# Patient Record
Sex: Female | Born: 1940 | Race: White | Hispanic: No | Marital: Single | Smoking: Never smoker
Health system: Southern US, Community
[De-identification: ages and names within clinical notes are randomized; demographics above are authoritative.]

## PROBLEM LIST (undated history)

## (undated) DIAGNOSIS — I471 Supraventricular tachycardia: Secondary | ICD-10-CM

## (undated) DIAGNOSIS — M1990 Unspecified osteoarthritis, unspecified site: Secondary | ICD-10-CM

---

## 1997-11-29 ENCOUNTER — Other Ambulatory Visit: Admission: RE | Admit: 1997-11-29 | Discharge: 1997-11-29 | Payer: Self-pay | Admitting: *Deleted

## 1998-12-03 ENCOUNTER — Other Ambulatory Visit: Admission: RE | Admit: 1998-12-03 | Discharge: 1998-12-03 | Payer: Self-pay | Admitting: *Deleted

## 1999-11-25 ENCOUNTER — Other Ambulatory Visit: Admission: RE | Admit: 1999-11-25 | Discharge: 1999-11-25 | Payer: Self-pay | Admitting: *Deleted

## 2000-11-30 ENCOUNTER — Other Ambulatory Visit: Admission: RE | Admit: 2000-11-30 | Discharge: 2000-11-30 | Payer: Self-pay | Admitting: *Deleted

## 2001-12-01 ENCOUNTER — Other Ambulatory Visit: Admission: RE | Admit: 2001-12-01 | Discharge: 2001-12-01 | Payer: Self-pay | Admitting: *Deleted

## 2002-12-05 ENCOUNTER — Other Ambulatory Visit: Admission: RE | Admit: 2002-12-05 | Discharge: 2002-12-05 | Payer: Self-pay | Admitting: *Deleted

## 2017-12-10 ENCOUNTER — Emergency Department: Payer: Medicare Other

## 2017-12-10 ENCOUNTER — Emergency Department
Admission: EM | Admit: 2017-12-10 | Discharge: 2017-12-10 | Disposition: A | Discharge status: Home / Self Care | Payer: Medicare Other | Attending: Emergency Medicine | Admitting: Emergency Medicine

## 2017-12-10 ENCOUNTER — Other Ambulatory Visit: Payer: Self-pay

## 2017-12-10 ENCOUNTER — Encounter: Payer: Self-pay | Admitting: Emergency Medicine

## 2017-12-10 DIAGNOSIS — R42 Dizziness and giddiness: Secondary | ICD-10-CM | POA: Diagnosis not present

## 2017-12-10 DIAGNOSIS — R51 Headache: Secondary | ICD-10-CM | POA: Insufficient documentation

## 2017-12-10 HISTORY — DX: Supraventricular tachycardia: I47.1

## 2017-12-10 HISTORY — DX: Unspecified osteoarthritis, unspecified site: M19.90

## 2017-12-10 LAB — COMPREHENSIVE METABOLIC PANEL
ALT: 16 U/L (ref 0–44)
AST: 22 U/L (ref 15–41)
Albumin: 4 g/dL (ref 3.5–5.0)
Alkaline Phosphatase: 43 U/L (ref 38–126)
Anion gap: 7 (ref 5–15)
BUN: 13 mg/dL (ref 8–23)
CO2: 27 mmol/L (ref 22–32)
CREATININE: 0.61 mg/dL (ref 0.44–1.00)
Calcium: 8.9 mg/dL (ref 8.9–10.3)
Chloride: 106 mmol/L (ref 98–111)
Glucose, Bld: 103 mg/dL — ABNORMAL HIGH (ref 70–99)
POTASSIUM: 4.1 mmol/L (ref 3.5–5.1)
SODIUM: 140 mmol/L (ref 135–145)
Total Bilirubin: 0.7 mg/dL (ref 0.3–1.2)
Total Protein: 6.6 g/dL (ref 6.5–8.1)

## 2017-12-10 LAB — CBC
HEMATOCRIT: 38 % (ref 35.0–47.0)
Hemoglobin: 12.9 g/dL (ref 12.0–16.0)
MCH: 29.4 pg (ref 26.0–34.0)
MCHC: 34.1 g/dL (ref 32.0–36.0)
MCV: 86.2 fL (ref 80.0–100.0)
Platelets: 220 10*3/uL (ref 150–440)
RBC: 4.41 MIL/uL (ref 3.80–5.20)
RDW: 13.6 % (ref 11.5–14.5)
WBC: 7.1 10*3/uL (ref 3.6–11.0)

## 2017-12-10 LAB — TROPONIN I: Troponin I: <0.03 ng/mL (ref <0.03)

## 2017-12-10 MED ORDER — MECLIZINE HCL 25 MG PO TABS: 25.0000 mg | ORAL_TABLET | Freq: Two times a day (BID) | ORAL | 0 refills | Status: AC | PRN

## 2017-12-10 NOTE — ED Triage Notes (Signed)
Says left side headache last night about 10 pm.  When she got up today feels like something wrong.  Says she feels dizzy,, lightheaded and off balance.

## 2017-12-10 NOTE — ED Triage Notes (Signed)
Triage by me 

## 2017-12-10 NOTE — ED Notes (Signed)
Dr. Cyril LoosenKinner aware pt has hx of dysphagia and could not complete swallow screen.

## 2017-12-10 NOTE — ED Provider Notes (Signed)
[facility] Emergency Department Provider Note   ____________________________________________    I have reviewed the triage vital signs and the nursing notes.   HISTORY  Chief Complaint Headache and Dizziness     HPI Erin Clark is a 77 y.o. female who presents with complaints of headache and dizziness.  Patient reports that approximately 10 PM last night before going to sleep she developed a sharp 7 out of 10 relatively abrupt pain in the left parietal area.  She said this is quite unusual for her and concerned her.  She had a sensation of something crawling on her head as well.  However it was relatively brief pain and she went to sleep.  This morning after getting up she felt as though the room was spinning and that it was difficult to walk straight.  She denies a history of vertigo.  She does report having some tinnitus earlier in the week but none currently.  No current headache.  No extremity weakness or numbness   Past Medical History:  Diagnosis Date  . Arthritis   . Atrial tachycardia (HCC)     There are no active problems to display for this patient.   History reviewed. No pertinent surgical history.  Prior to Admission medications   Medication Sig Start Date End Date Taking? Authorizing Provider  Cholecalciferol (D3-1000) 1000 units tablet Take 1,000 Units by mouth daily.   Yes [provider]  metoprolol tartrate (LOPRESSOR) 25 MG tablet Take 12.5 mg by mouth 2 (two) times daily. 12/01/17  Yes [provider]  mometasone (ELOCON) 0.1 % cream Apply 1 application topically 2 (two) times daily as needed for irritation. 10/20/17  Yes [provider]  Multiple Vitamin (MULTI-VITAMINS) TABS Take 1 tablet by mouth daily.   Yes [provider]  Calcium Carb-Cholecalciferol (CALCIUM 1000 + D PO) Take 1,000 Units by mouth daily.     [provider]  meclizine (ANTIVERT) 25 MG tablet Take 1 tablet (25 mg  total) by mouth 2 (two) times daily as needed for dizziness. 12/10/17   Jene EveryKinner, Alexander Mcauley, MD     Allergies Amoxicillin  No family history on file.  Social History Social History   Tobacco Use  . Smoking status: Never Smoker  . Smokeless tobacco: Never Used  Substance Use Topics  . Alcohol use: Yes  . Drug use: Not on file    Review of Systems  Constitutional: Dizziness as above Eyes: No visual changes.  ENT: No neck pain Cardiovascular: Denies chest pain.  No palpitations Respiratory: Denies shortness of breath. Gastrointestinal:No nausea, no vomiting.   Genitourinary: Negative for dysuria. Musculoskeletal: Negative for back pain. Skin: Negative for rash. Neurological: As above   ____________________________________________   PHYSICAL EXAM:  VITAL SIGNS: ED Triage Vitals  Enc Vitals Group     BP 12/10/17 0756 (!) 171/62     Pulse Rate 12/10/17 0756 68     Resp 12/10/17 0756 14     Temp 12/10/17 0756 97.8 F (36.6 C)     Temp Source 12/10/17 0756 Oral     SpO2 12/10/17 0756 97 %     Weight 12/10/17 0757 61.2 kg (135 lb)     Height 12/10/17 0757 1.6 m (5\' 3" )     Head Circumference --      Peak Flow --      Pain Score 12/10/17 0757 0     Pain Loc --      Pain Edu? --  Excl. in GC? --     Constitutional: Alert and oriented. No acute distress. Pleasant and interactive Eyes: Conjunctivae are normal.  PERRLA Head: Atraumatic. ENT: Normal TMs bilaterally Mouth/Throat: Mucous membranes are moist.   Neck:  Painless ROM Cardiovascular: Normal rate, regular rhythm. Grossly normal heart sounds.  Good peripheral circulation. Respiratory: Normal respiratory effort.  No retractions. Lungs CTAB. Gastrointestinal: Soft and nontender. No distention.   Musculoskeletal: Normal strength in all extremities, warm and well-perfused Neurologic:  Normal speech and language. No gross focal neurologic deficits are appreciated.  Cranial nerves II through XII are normal, no  dysdiadochokinesis.  Upon standing the patient feels somewhat unsteady and drifts to the left but is able to maintain her balance Skin:  Skin is warm, dry and intact. No rash noted. Psychiatric: Mood and affect are normal. Speech and behavior are normal.  ____________________________________________   LABS (all labs ordered are listed, but only abnormal results are displayed)  Labs Reviewed  COMPREHENSIVE METABOLIC PANEL - Abnormal; Notable for the following components:      Result Value   Glucose, Bld 103 (*)    All other components within normal limits  CBC  TROPONIN I   ____________________________________________  EKG  ED ECG REPORT I, Jene Everyobert Krislynn Gronau, the attending physician, personally viewed and interpreted this ECG.  Date: 12/10/2017  Rhythm: normal sinus rhythm QRS Axis: normal Intervals: normal ST/T Wave abnormalities: normal Narrative Interpretation: no evidence of acute ischemia  ____________________________________________  RADIOLOGY  CT head ____________________________________________   PROCEDURES  Procedure(s) performed: No  Procedures   Critical Care performed: No ____________________________________________   INITIAL IMPRESSION / ASSESSMENT AND PLAN / ED COURSE  Pertinent labs & imaging results that were available during my care of the patient were reviewed by me and considered in my medical decision making (see chart for details).  Patient presents with dizziness and a headache as noted above, currently her symptoms are mostly relieved although still unsteady with standing.  Differential includes a peripheral vertigo versus potentially a CVA the onset of which would have been when she had a headache last night at 10 PM, outside of the TPA window.  We will check labs, CT head and reevaluate  Lab work is reassuring, CT head is normal.  Will obtain MRI to rule out CVA   MRI negative, patient asymptomatic.  Suspect vertigo as the cause of  her symptoms.  Follow-up with ENT as needed,    ____________________________________________   FINAL CLINICAL IMPRESSION(S) / ED DIAGNOSES  Final diagnoses:  Vertigo        Note:  This document was prepared using Dragon voice recognition software and may include unintentional dictation errors.    Jene EveryKinner, Kerolos Nehme, MD 12/10/17 1332

## 2018-02-11 ENCOUNTER — Other Ambulatory Visit: Payer: Self-pay

## 2018-02-11 ENCOUNTER — Encounter: Payer: Self-pay | Admitting: Occupational Therapy

## 2018-02-11 ENCOUNTER — Ambulatory Visit: Payer: Medicare Other | Attending: Internal Medicine | Admitting: Occupational Therapy

## 2018-02-11 DIAGNOSIS — M25642 Stiffness of left hand, not elsewhere classified: Secondary | ICD-10-CM

## 2018-02-11 DIAGNOSIS — M25641 Stiffness of right hand, not elsewhere classified: Secondary | ICD-10-CM | POA: Diagnosis present

## 2018-02-11 NOTE — Therapy (Signed)
[facility] [address] [place], [address] Phone: [phone]   Fax:  [phone]  Occupational Therapy Evaluation  Patient Details  Name: Erin Clark MRN: [id number] Date of Birth: October 06, 1940 Referring Provider (OT): Wilfrid Lund Date: 02/11/2018  OT End of Session - 02/11/18 1607    Visit Number  1    Number of Visits  3    Date for OT Re-Evaluation  03/11/18    OT Start Time  1002    OT Stop Time  1100    OT Time Calculation (min)  58 min    Activity Tolerance  Patient tolerated treatment well    Behavior During Therapy  [facility] for tasks assessed/performed       Past Medical History:  Diagnosis Date  . Arthritis   . Atrial tachycardia (HCC)     History reviewed. No pertinent surgical history.  There were no vitals filed for this visit.  Subjective Assessment - 02/11/18 1501    Subjective   I have OA for while - and I don't really have pain but my fingers are tight and stiff when making fist - and cannot make full fist -and my middle finger worse on the R , L few fingers - I just don't want my hands to get worse     Patient Stated Goals  I don't want my fingers to get worse - if I can do at home to keep my motion, strength     Currently in Pain?  No/denies        [facility] OT Assessment - 02/11/18 0001      Assessment   Medical Diagnosis  OA with bilateral hand pain     Referring Provider (OT)  Renard Matter    Onset Date/Surgical Date  11/11/17    Hand Dominance  Right      Home  Environment   Lives With  Alone      Prior Function   Vocation  Retired    Leisure  retired Runner, broadcasting/film/video , like some gardening , but pots, [employer] work , Physiological scientist Grip (lbs)  37    Right Hand Lateral Pinch  14 lbs    Right Hand 3 Point Pinch  12 lbs    Left Hand Grip (lbs)  36    Left Hand Lateral Pinch  13 lbs    Left Hand 3 Point Pinch  10 lbs      Right Hand AROM   R Index  MCP 0-90   90 Degrees    R Index PIP 0-100  95 Degrees    R Long  MCP 0-90  100 Degrees    R Long PIP 0-100  65 Degrees    R Ring  MCP 0-90  100 Degrees    R Ring PIP 0-100  90 Degrees    R Little  MCP 0-90  100 Degrees    R Little PIP 0-100  90 Degrees      Left Hand AROM   L Index  MCP 0-90  100 Degrees    L Index PIP 0-100  65 Degrees    L Long  MCP 0-90  105 Degrees    L Long PIP 0-100  85 Degrees    L Ring  MCP 0-90  100 Degrees    L Ring PIP 0-100  90 Degrees    L Little  MCP 0-90  105 Degrees    L Little PIP 0-100  95 Degrees             Paraffin done to bilateral hands prior to review of HEP     Moist heat Tendon glides- only AROM -do not force  Opposition to all digits RD of digits  Oval 8 splint on R 4th PIP to decrease hyper extention of PIP - and clicking or swannek   to use during day - as needed and with activities - increase tolerance gradually   Joint protection info and AE info review and hand out provided    o     OT Education - 02/11/18 1607    Education Details  findings of eval , HEP and joint protection /AE trng     Person(s) Educated  Patient    Methods  Explanation;Demonstration;Handout    Comprehension  Verbalized understanding;Returned demonstration       OT Short Term Goals - 02/11/18 1616      OT SHORT TERM GOAL #1   Title  Pt to be independent in HEP to increase or maintain AROM in digits, grip strength and prevent deformities or contractures     Baseline  no knowledge     Time  22    Period  Weeks    Status  New    Target Date  02/25/18        OT Long Term Goals - 02/11/18 1617      OT LONG TERM GOAL #1   Title  Pt to verbalize and demo 3 joint protection principles or AE she use to increase ease on joints in home act    Baseline  no knowledge    Time  4    Period  Weeks    Status  New    Target Date  03/11/18            Plan - 02/11/18 1608    Clinical Impression Statement  Pt present at OT eval with  diagnosis of  OA with bilateral hand stiffness- L hand worse than R - pt show decrease PIP flexion with hyper mobiliity out of MC's - hyper extention of R 4th - with swanneck - pt fitted with Oval 8 to decrease  swanneck risk - pt do show clicking of that joint - pt show on L hand ulnar deivation of digits but no subluxation - pt was ed on HEP for ROM , joint protection  and AE education was done - pt can benefit from OT services to maintain and increase ROM - decrease risk for contractures ,and pain - and iindependence in ADL's and IADL's     Occupational performance deficits (Please refer to evaluation for details):  ADL's;IADL's;Play;Leisure    Rehab Potential  Good    Current Impairments/barriers affecting progress:  chronic condition -     OT Frequency  Biweekly    OT Duration  4 weeks    OT Treatment/Interventions  Self-care/ADL training;Therapeutic exercise;Patient/family education;Paraffin;Passive range of motion;Manual Therapy;DME and/or AE instruction    Plan  progress with homeprogram  and adjust or update as needed     Clinical Decision Making  Several treatment options, min-mod task modification necessary    OT Home Exercise Plan  see pt instruction    Consulted and Agree with Plan of Care  Patient       Patient will benefit from skilled therapeutic intervention in order to improve the following deficits and impairments:  Impaired  flexibility, Pain, Decreased coordination, Decreased strength, Decreased range of motion, Impaired UE functional use  Visit Diagnosis: Stiffness of left hand, not elsewhere classified - Plan: Ot plan of care cert/re-cert  Stiffness of right hand, not elsewhere classified - Plan: Ot plan of care cert/re-cert    Problem List There are no active problems to display for this patient.   Oletta Cohn OTR/L,CLT 02/11/2018, 4:24 PM  [facility] [address], [place], [address] Phone:  [phone]   Fax:  [phone]  Name: Erin Clark MRN: [id number] Date of Birth: 13-Sep-1940

## 2018-02-11 NOTE — Patient Instructions (Signed)
Moist heat Tendon glides- only AROM -do not force  Opposition to all digits RD of digits  Oval 8 splint on R 4th PIP to decrease hyper extention of PIP - and clicking or swannek   to use during day - as needed and with activities - increase tolerance gradually   Joint protection info and AE info review and hand out provided

## 2018-02-22 ENCOUNTER — Ambulatory Visit: Payer: Medicare Other | Admitting: Occupational Therapy

## 2018-02-22 DIAGNOSIS — M25642 Stiffness of left hand, not elsewhere classified: Secondary | ICD-10-CM | POA: Diagnosis not present

## 2018-02-22 DIAGNOSIS — M25641 Stiffness of right hand, not elsewhere classified: Secondary | ICD-10-CM

## 2018-02-22 NOTE — Therapy (Signed)
[place] [facility] [address], [place], [address] Phone: [phone]   Fax:  [phone]  Occupational Therapy Treatment  Patient Details  Name: Erin Clark MRN: [id number] Date of Birth: 05-27-1940 Referring Provider (OT): Wilfrid Lund Date: 02/22/2018  OT End of Session - 02/22/18 1417    Visit Number  2    Number of Visits  3    Date for OT Re-Evaluation  03/11/18    OT Start Time  1316    OT Stop Time  1350    OT Time Calculation (min)  34 min    Activity Tolerance  Patient tolerated treatment well    Behavior During Therapy  [facility] for tasks assessed/performed       Past Medical History:  Diagnosis Date  . Arthritis   . Atrial tachycardia (HCC)     No past surgical history on file.  There were no vitals filed for this visit.  Subjective Assessment - 02/22/18 1415    Subjective   I did most every thing you told me - on and off - my brother past away and I had mouth surgery - so it was busy since I seen you last - but I am trying to change the way I use my hands     Patient Stated Goals  I don't want my fingers to get worse - if I can do at home to keep my motion, strength     Currently in Pain?  No/denies         [facility] OT Assessment - 02/22/18 0001      Strength   Right Hand Grip (lbs)  45    Right Hand Lateral Pinch  15 lbs    Right Hand 3 Point Pinch  14 lbs    Left Hand Grip (lbs)  41    Left Hand Lateral Pinch  13 lbs    Left Hand 3 Point Pinch  10 lbs      Left Hand AROM   L Index PIP 0-100  70 Degrees    L Ring PIP 0-100  95 Degrees        Assess AROM in bilateral hands  And grip /prehension strength - see flowsheet Assess oval 8 on R 4th - appear to large - will fit when size 8 comes in        OT Treatments/Exercises (OP) - 02/22/18 0001      RUE Paraffin   Number Minutes Paraffin  10 Minutes    RUE Paraffin Location  Hand    Comments  decrease stiffness prior  to soft tissue mobs       LUE Paraffin   Number Minutes Paraffin  10 Minutes    LUE Paraffin Location  Hand    Comments  prior to soft tissue mobs to decrease stiffness       soft tissue mobs done to bilateral hands - MC spreads and to webspace prior to ROM  Review tendon glides  And Opposition  RD of digits   Review joint protection  And AE  Large joint , avoid tight and prolonged stretch  Pt report making changes -        OT Education - 02/22/18 1417    Education Details  review homeprogram for joint protection, AE and ROM     Person(s) Educated  Patient    Methods  Explanation;Demonstration;Handout    Comprehension  Verbalized understanding;Returned demonstration  OT Short Term Goals - 02/11/18 1616      OT SHORT TERM GOAL #1   Title  Pt to be independent in HEP to increase or maintain AROM in digits, grip strength and prevent deformities or contractures     Baseline  no knowledge     Time  22    Period  Weeks    Status  New    Target Date  02/25/18        OT Long Term Goals - 02/11/18 1617      OT LONG TERM GOAL #1   Title  Pt to verbalize and demo 3 joint protection principles or AE she use to increase ease on joints in home act    Baseline  no knowledge    Time  4    Period  Weeks    Status  New    Target Date  03/11/18            Plan - 02/22/18 1418    Clinical Impression Statement  Pt with diagnosis of OA pt show increase AROM at PIP of L 2nd -and increase grip bilateral hands and increase prehension on R - pt show understanding for HEP  , joint protection and AE -  pt will return upon size 8 oval 8 splint comes in to fit on R 4th PIP  - and cont with hoemprogram     Occupational performance deficits (Please refer to evaluation for details):  ADL's;IADL's;Play;Leisure    Rehab Potential  Good    Current Impairments/barriers affecting progress:  chronic condition -     OT Frequency  Biweekly    OT Duration  2 weeks    OT  Treatment/Interventions  Self-care/ADL training;Therapeutic exercise;Patient/family education;Paraffin;Passive range of motion;Manual Therapy;DME and/or AE instruction    Plan  fit pt with smaller oval 8 size 8 splint on R  4th PIP     Clinical Decision Making  Several treatment options, min-mod task modification necessary    OT Home Exercise Plan  see pt instruction    Consulted and Agree with Plan of Care  Patient       Patient will benefit from skilled therapeutic intervention in order to improve the following deficits and impairments:  Impaired flexibility, Pain, Decreased coordination, Decreased strength, Decreased range of motion, Impaired UE functional use  Visit Diagnosis: Stiffness of left hand, not elsewhere classified  Stiffness of right hand, not elsewhere classified    Problem List There are no active problems to display for this patient.   Oletta Cohn OTR/L,CLT 02/22/2018, 2:22 PM   [facility] [address] [place], [address] Phone: [phone]   Fax:  [phone]  Name: Erin Clark MRN: [id number] Date of Birth: [birth year]

## 2018-02-22 NOTE — Patient Instructions (Signed)
Same

## 2020-02-04 IMAGING — MR MR HEAD W/O CM
10 series · 48 of 48 positions shown · non-contrast
Comparison: Head CT without contrast 905 hours today.

CLINICAL DATA: 77-year-old female with left side headache since
[DATE] hours. Dizziness, off balance.

EXAM:
MRI HEAD WITHOUT CONTRAST
TECHNIQUE: Multiplanar, multiecho pulse sequences of the brain and surrounding
structures were obtained without intravenous contrast.

[Series 2: T1 · sagittal · 5.0mm · 0.45mm/px · 2 of 29 slices shown (1 of 2)]
[im 1/29]
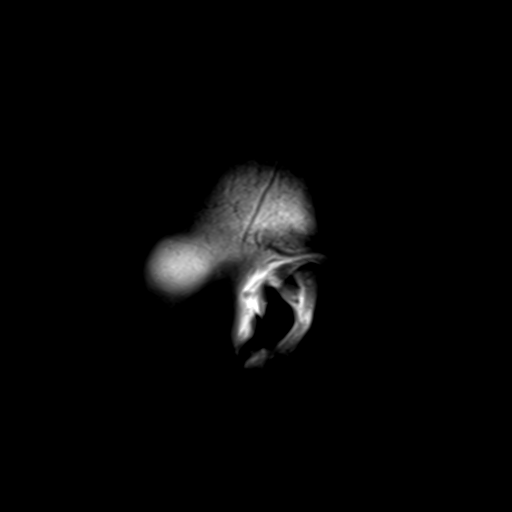
[im 29/29]
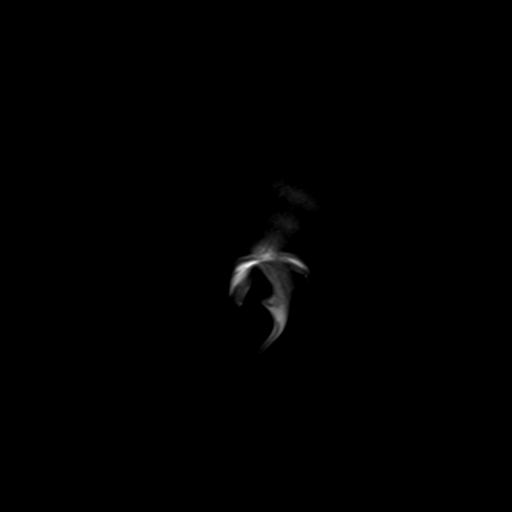

[Series 4: DWI · axial · 3.0mm · 1.80mm/px · z∈[-24,+134]mm · 5 of 52 slices shown (1 of 2)]
[im 1/52]
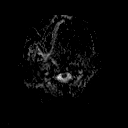
[im 13/52]
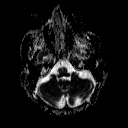
[im 26/52]
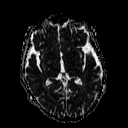
[im 39/52]
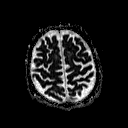
[im 52/52]
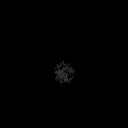

[Series 6: DWI · coronal · 3.0mm · 1.80mm/px · 4 of 48 slices shown (2 of 2)]
[im 1/48]
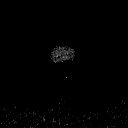
[im 16/48]
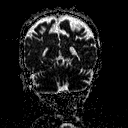
[im 32/48]
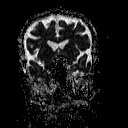
[im 48/48]
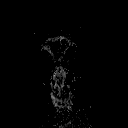

[Series 7: T2 · axial · 5.0mm · 0.60mm/px · z∈[-23,+130]mm · 2 of 25 slices shown (1 of 3)]
[im 1/25]
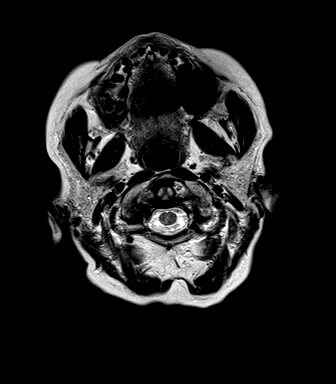
[im 25/25]
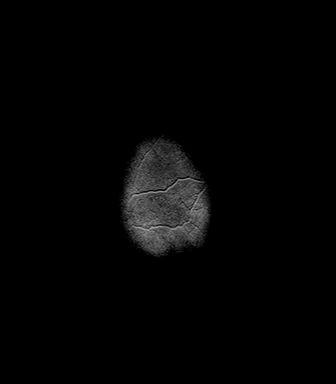

[Series 8: FLAIR · axial · 3.0mm · 0.45mm/px · z∈[-23,+130]mm · 5 of 53 slices shown]
[im 1/53]
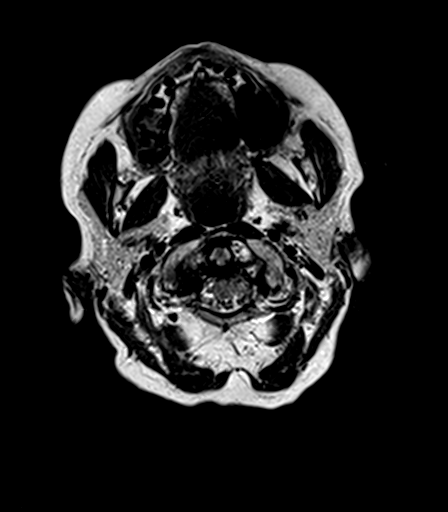
[im 14/53]
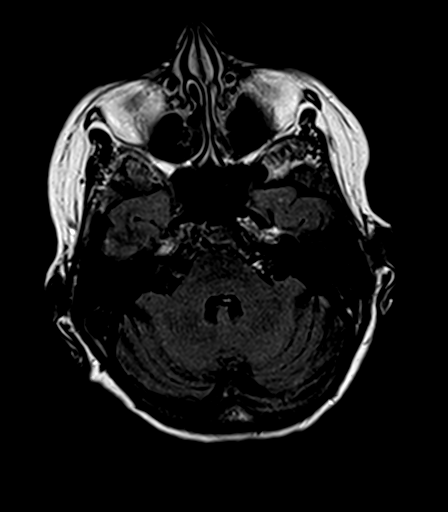
[im 27/53]
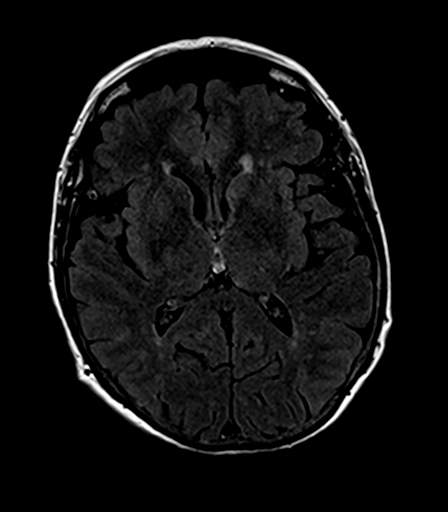
[im 40/53]
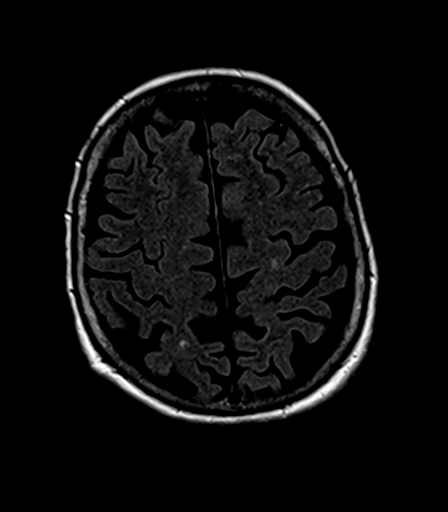
[im 53/53]
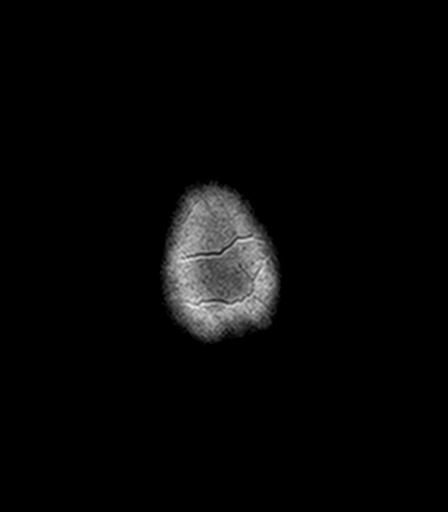

[Series 9: T2 · axial · 5.0mm · 0.45mm/px · z∈[-23,+130]mm · 2 of 25 slices shown (2 of 3)]
[im 1/25]
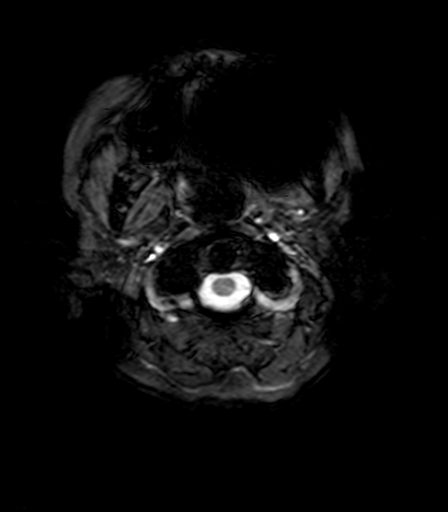
[im 25/25]
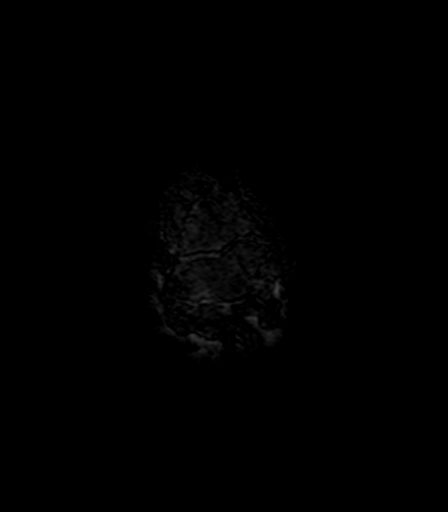

[Series 10: T1 · axial · 1.0mm · 1.00mm/px · z∈[-30,+142]mm · 16 of 176 slices shown (2 of 2)]
[im 1/176]
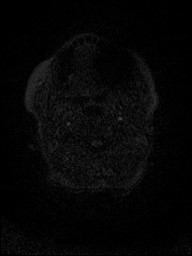
[im 12/176]
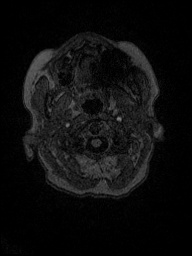
[im 24/176]
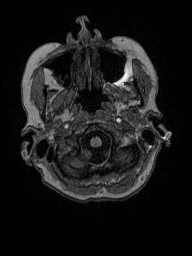
[im 36/176]
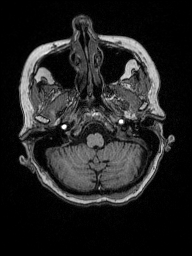
[im 47/176]
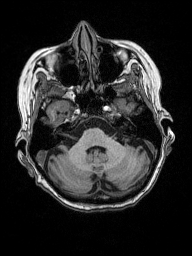
[im 59/176]
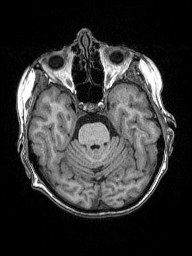
[im 71/176]
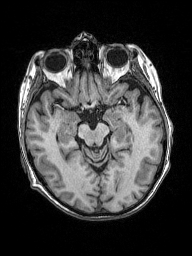
[im 82/176]
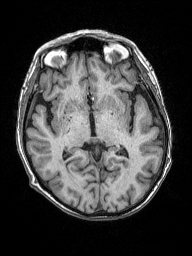
[im 94/176]
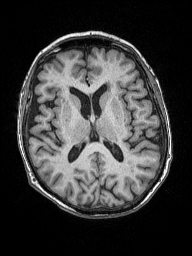
[im 106/176]
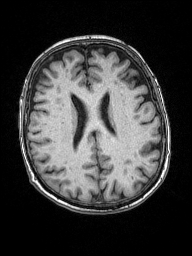
[im 117/176]
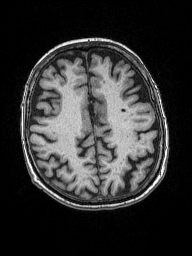
[im 129/176]
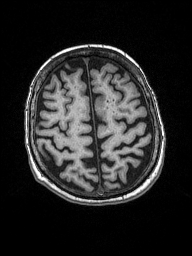
[im 141/176]
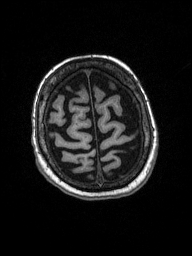
[im 152/176]
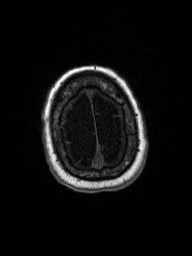
[im 164/176]
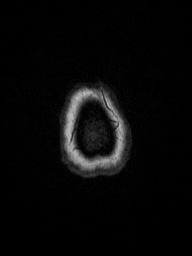
[im 176/176]
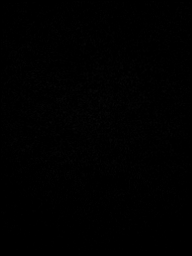

[Series 11: T2 · coronal · 5.0mm · 0.49mm/px · 3 of 29 slices shown (3 of 3)]
[im 1/29]
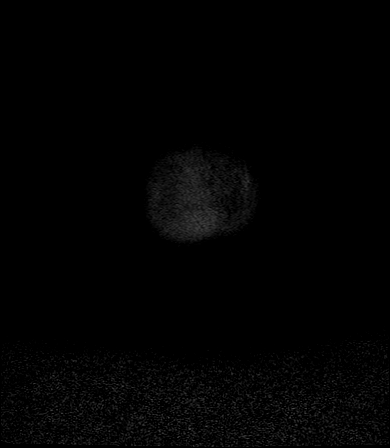
[im 15/29]
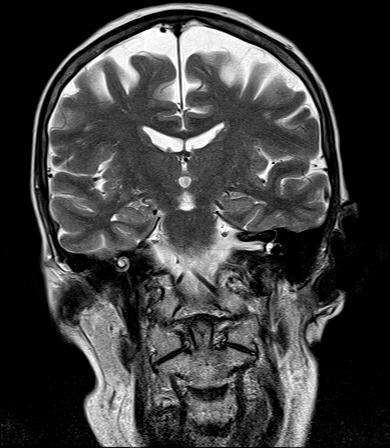
[im 29/29]
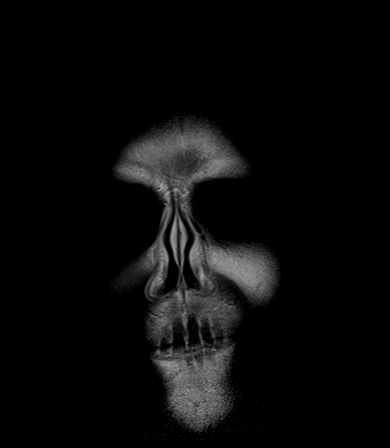

[Series 100: ax (id) · axial · 3.0mm · 1.80mm/px · z∈[-24,+134]mm · 5 of 55 slices shown]
[im 1/55]
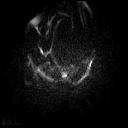
[im 14/55]
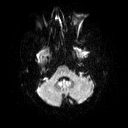
[im 28/55]
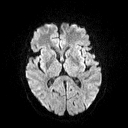
[im 41/55]
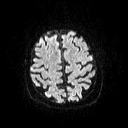
[im 55/55]
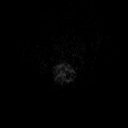

[Series 101: cor (id) · coronal · 3.0mm · 1.80mm/px · 4 of 48 slices shown]
[im 1/48]
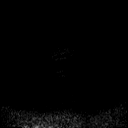
[im 16/48]
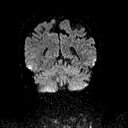
[im 32/48]
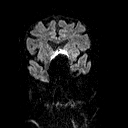
[im 48/48]
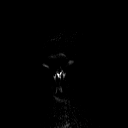

[48 of 48 positions shown; findings below may reference images not displayed]

FINDINGS: Brain: No restricted diffusion to suggest acute infarction. No
midline shift, mass effect, evidence of mass lesion,
ventriculomegaly, extra-axial collection or acute intracranial
hemorrhage. Cervicomedullary junction and pituitary are within
normal limits.

Cerebral volume is within normal limits for age. Scattered small
foci of nonspecific cerebral white matter T2 and FLAIR
hyperintensity in both hemispheres, mostly subcortical. No
superimposed cortical encephalomalacia. No chronic cerebral blood
products. The bilateral deep gray matter nuclei, brainstem, and
cerebellum appear normal for age.

Vascular: Major intracranial vascular flow voids are preserved. The
distal left vertebral artery appears mildly dominant.

Skull and upper cervical spine: Normal visible cervical spine.
Normal bone marrow signal.

Sinuses/Orbits: Postoperative changes to both globes. Otherwise
normal orbits soft tissues. Paranasal sinuses and mastoids are
stable and well pneumatized.

Other: Visible internal auditory structures appear normal. Normal
stylomastoid foramina. Scalp and face soft tissues appear negative.
IMPRESSION: 1.  No acute intracranial abnormality.
2. Largely unremarkable for age; mild for age nonspecific cerebral
white matter signal changes.

## 2021-09-25 ENCOUNTER — Ambulatory Visit (LOCAL_COMMUNITY_HEALTH_CENTER): Payer: Medicare Other

## 2021-09-25 DIAGNOSIS — Z23 Encounter for immunization: Secondary | ICD-10-CM | POA: Diagnosis not present

## 2021-09-25 NOTE — Progress Notes (Signed)
  Are you feeling sick today? No   Have you ever received a dose of COVID-19 Vaccine? [employer], [place], [place], [place], Other) Yes  If yes, which vaccine and how many doses?   Pfizer and 5 doses   Did you bring the vaccination record card or other documentation?  Yes   Do you have a health condition or are undergoing treatment that makes you moderately or severely immunocompromised? This would include, but not be limited to: cancer, HIV, organ transplant, immunosuppressive therapy/high-dose corticosteroids, or moderate/severe primary immunodeficiency.  No  Have you received COVID-19 vaccine before or during hematopoietic cell transplant (HCT) or CAR-T-cell therapies? No  Have you ever had an allergic reaction to: (This would include a severe allergic reaction or a reaction that caused hives, swelling, or respiratory distress, including wheezing.) A component of a COVID-19 vaccine or a previous dose of COVID-19 vaccine? No   Have you ever had an allergic reaction to another vaccine (other thanCOVID-19 vaccine) or an injectable medication? (This would include a severe allergic reaction or a reaction that caused hives, swelling, or respiratory distress, including wheezing.)   No    Do you have a history of any of the following:  Myocarditis or Pericarditis No  Dermal fillers:  No  Multisystem Inflammatory Syndrome (MIS-C or MIS-A)? No  COVID-19 disease within the past 3 months? No  Vaccinated with monkeypox vaccine in the last 4 weeks? No  In Nurse Clinic for [facility] booster. Tolerated vaccine well. Updated Covid vaccine card and [facility] copy given. Josie Saunders, RN

## 2021-12-15 ENCOUNTER — Other Ambulatory Visit: Payer: Self-pay | Admitting: Unknown Physician Specialty

## 2021-12-15 DIAGNOSIS — J329 Chronic sinusitis, unspecified: Secondary | ICD-10-CM

## 2022-01-02 ENCOUNTER — Ambulatory Visit
Admission: RE | Admit: 2022-01-02 | Discharge: 2022-01-02 | Disposition: A | Discharge status: Home / Self Care | Payer: Medicare PPO | Source: Ambulatory Visit | Attending: Unknown Physician Specialty | Admitting: Unknown Physician Specialty

## 2022-01-02 DIAGNOSIS — J329 Chronic sinusitis, unspecified: Secondary | ICD-10-CM

## 2022-02-25 ENCOUNTER — Ambulatory Visit (LOCAL_COMMUNITY_HEALTH_CENTER): Payer: Medicare PPO

## 2022-02-25 DIAGNOSIS — Z719 Counseling, unspecified: Secondary | ICD-10-CM

## 2022-02-25 DIAGNOSIS — Z23 Encounter for immunization: Secondary | ICD-10-CM

## 2022-02-25 NOTE — Progress Notes (Signed)
  Are you feeling sick today? No   Have you ever received a dose of COVID-19 Vaccine? [employer], [place], [place], [place], Other) Yes  If yes, which vaccine and how many doses?   PFIZER, 6   Did you bring the vaccination record card or other documentation?  Yes   Do you have a health condition or are undergoing treatment that makes you moderately or severely immunocompromised? This would include, but not be limited to: cancer, HIV, organ transplant, immunosuppressive therapy/high-dose corticosteroids, or moderate/severe primary immunodeficiency.  No  Have you received COVID-19 vaccine before or during hematopoietic cell transplant (HCT) or CAR-T-cell therapies? No  Have you ever had an allergic reaction to: (This would include a severe allergic reaction or a reaction that caused hives, swelling, or respiratory distress, including wheezing.) A component of a COVID-19 vaccine or a previous dose of COVID-19 vaccine? Yes (Levaquin, amoxicillin caused hives.   Have you ever had an allergic reaction to another vaccine (other thanCOVID-19 vaccine) or an injectable medication? (This would include a severe allergic reaction or a reaction that caused hives, swelling, or respiratory distress, including wheezing.)   No    Do you have a history of any of the following:  Myocarditis or Pericarditis No  Dermal fillers:  No  Multisystem Inflammatory Syndrome (MIS-C or MIS-A)? No  COVID-19 disease within the past 3 months? No  Vaccinated with monkeypox vaccine in the last 4 weeks? No  Eligible, administered covid vaccine, monitored, tolerated well. Provided covid record card, VIS and [facility]IR copy. M.Patterson Hollenbaugh, LPN.

## 2023-03-12 ENCOUNTER — Ambulatory Visit: Payer: Medicare PPO

## 2024-05-17 ENCOUNTER — Other Ambulatory Visit: Payer: Self-pay | Admitting: Internal Medicine

## 2024-05-17 DIAGNOSIS — Z136 Encounter for screening for cardiovascular disorders: Secondary | ICD-10-CM

## 2024-05-17 DIAGNOSIS — R002 Palpitations: Secondary | ICD-10-CM
